# Patient Record
Sex: Male | Born: 2000 | Race: White | Hispanic: No | Marital: Single | Smoking: Never smoker
Health system: Southern US, Community
[De-identification: ages and names within clinical notes are randomized; demographics above are authoritative.]

---

## 2019-11-14 ENCOUNTER — Other Ambulatory Visit: Payer: Self-pay | Admitting: Gastroenterology

## 2019-11-14 DIAGNOSIS — K219 Gastro-esophageal reflux disease without esophagitis: Secondary | ICD-10-CM

## 2019-11-22 ENCOUNTER — Ambulatory Visit
Admission: RE | Admit: 2019-11-22 | Discharge: 2019-11-22 | Disposition: A | Discharge status: Home / Self Care | Payer: Self-pay | Source: Ambulatory Visit | Attending: Gastroenterology | Admitting: Gastroenterology

## 2019-11-22 DIAGNOSIS — K219 Gastro-esophageal reflux disease without esophagitis: Secondary | ICD-10-CM

## 2019-12-06 ENCOUNTER — Other Ambulatory Visit: Payer: Self-pay | Admitting: Gastroenterology

## 2019-12-06 DIAGNOSIS — R1011 Right upper quadrant pain: Secondary | ICD-10-CM

## 2019-12-06 DIAGNOSIS — R748 Abnormal levels of other serum enzymes: Secondary | ICD-10-CM

## 2019-12-15 ENCOUNTER — Ambulatory Visit
Admission: RE | Admit: 2019-12-15 | Discharge: 2019-12-15 | Disposition: A | Discharge status: Home / Self Care | Payer: BC Managed Care – PPO | Source: Ambulatory Visit | Attending: Gastroenterology | Admitting: Gastroenterology

## 2019-12-15 DIAGNOSIS — R1011 Right upper quadrant pain: Secondary | ICD-10-CM

## 2019-12-15 DIAGNOSIS — R748 Abnormal levels of other serum enzymes: Secondary | ICD-10-CM

## 2020-01-17 ENCOUNTER — Other Ambulatory Visit: Payer: Self-pay | Admitting: Gastroenterology

## 2020-01-17 DIAGNOSIS — R748 Abnormal levels of other serum enzymes: Secondary | ICD-10-CM

## 2020-01-24 ENCOUNTER — Ambulatory Visit
Admission: RE | Admit: 2020-01-24 | Discharge: 2020-01-24 | Disposition: A | Discharge status: Home / Self Care | Payer: BC Managed Care – PPO | Source: Ambulatory Visit | Attending: Gastroenterology | Admitting: Gastroenterology

## 2020-01-24 DIAGNOSIS — R748 Abnormal levels of other serum enzymes: Secondary | ICD-10-CM

## 2020-04-01 NOTE — Progress Notes (Signed)
Cardiology Office Note:    Date:  04/02/2020   ID:  Thomas Fletcher, DOB [birth year], MRN [id number]  PCP:  Donnie Coffin, MD  Cardiologist:  No primary care provider on file.  Electrophysiologist:  None   Referring MD: Christa See, FNP   Chief Complaint  Patient presents with  . Chest Pain    History of Present Illness:    Thomas Fletcher is a 19 y.o. male with a hx of NAFLD, BPD, GAD for who is referred by Christa See, FNP for evaluation of chest pain, dyspnea, heart murmur.  Reports chest pain started last summer.  Has been occurring about 1-2 times per month.  Describes as left-sided dull aching pain, can last for several days.  Typically will wake up with it in and will persist for several days.  Reports that he runs on the treadmill every other day for 3.5 miles.  Denies any chest pain with this.  Reports chest pain improves with exertion.  Does report that he has shortness of breath.  Particularly notices when he walks upstairs.  Recent labs showed LDL 112 on 3/31.  No smoking history.  No history of heart disease in his immediate family.    No past medical history on file.    Current Medications: Current Meds  Medication Sig  . Acetylcysteine (NAC PO) Take by mouth.  . lactobacillus acidophilus (BACID) TABS tablet Take 1 tablet by mouth daily.  . Multiple Vitamin (MULTIVITAMIN) tablet Take 1 tablet by mouth daily.  . Omega-3 1000 MG CAPS Take 1 tablet by mouth daily.     Allergies:   Patient has no allergy information on record.   Social History   Socioeconomic History  . Marital status: Single    Spouse name: Not on file  . Number of children: Not on file  . Years of education: Not on file  . Highest education level: Not on file  Occupational History  . Not on file  Tobacco Use  . Smoking status: Never Smoker  . Smokeless tobacco: Never Used  Substance and Sexual Activity  . Alcohol use: Not on file  . Drug use: Not on file  . Sexual activity: Not on file    Other Topics Concern  . Not on file  Social History Narrative  . Not on file   Social Determinants of Health   Financial Resource Strain:   . Difficulty of Paying Living Expenses:   Food Insecurity:   . Worried About Charity fundraiser in the Last Year:   . Arboriculturist in the Last Year:   Transportation Needs:   . Film/video editor (Medical):   Marland Kitchen Lack of Transportation (Non-Medical):   Physical Activity:   . Days of Exercise per Week:   . Minutes of Exercise per Session:   Stress:   . Feeling of Stress :   Social Connections:   . Frequency of Communication with Friends and Family:   . Frequency of Social Gatherings with Friends and Family:   . Attends Religious Services:   . Active Member of Clubs or Organizations:   . Attends Archivist Meetings:   Marland Kitchen Marital Status:      Family History: No history of heart disease in his immediate family  ROS:   Please see the history of present illness.     All other systems reviewed and are negative.  EKGs/Labs/Other Studies Reviewed:    The following studies were reviewed today:   EKG:  EKG is  ordered today.  The ekg ordered today demonstrates normal sinus rhythm, rate 61, no ST/T abnormalities  Recent Labs: No results found for requested labs within last 8760 hours.  Recent Lipid Panel No results found for: CHOL, TRIG, HDL, CHOLHDL, VLDL, LDLCALC, LDLDIRECT  Physical Exam:    VS:  BP 130/76   Pulse 61   Ht 5\' 9"  (1.753 m)   Wt 174 lb 6.4 oz (79.1 kg)   BMI 25.75 kg/m     Wt Readings from Last 3 Encounters:  04/02/20 174 lb 6.4 oz (79.1 kg)     GEN:  Well nourished, well developed in no acute distress HEENT: Normal NECK: No JVD LYMPHATICS: No lymphadenopathy CARDIAC: RRR, 1/6 systolic heart murmur RESPIRATORY:  Clear to auscultation without rales, wheezing or rhonchi  ABDOMEN: Soft, non-tender, non-distended MUSCULOSKELETAL:  No edema; No deformity  SKIN: Warm and dry NEUROLOGIC:  Alert  and oriented x 3 PSYCHIATRIC:  Normal affect   ASSESSMENT:    1. Cardiac murmur   2. Chest pain of uncertain etiology   3. Hyperlipidemia, unspecified hyperlipidemia type    PLAN:     Chest pain: Noncardiac in description, improves with exertion.  Given age and lack of risk factors, no further cardiac work-up recommended  Heart murmur: Suspect benign flow murmur, will check TTE to rule out any structural heart disease.  Hyperlipidemia: LDL 112, reports previously was in 130s but has improved with diet/exercise.  Given age and lack of risk factors, no indication for statin at this time.  RTC as needed   Medication Adjustments/Labs and Tests Ordered: Current medicines are reviewed at length with the patient today.  Concerns regarding medicines are outlined above.  Orders Placed This Encounter  Procedures  . EKG 12-Lead  . ECHOCARDIOGRAM COMPLETE   No orders of the defined types were placed in this encounter.   Patient Instructions  Medication Instructions:  Your physician recommends that you continue on your current medications as directed. Please refer to the Current Medication list given to you today.  Testing/Procedures: Your physician has requested that you have an echocardiogram. Echocardiography is a painless test that uses sound waves to create images of your heart. It provides your doctor with information about the size and shape of your heart and how well your heart's chambers and valves are working. This procedure takes approximately one hour. There are no restrictions for this procedure. This will be done at our [facility] location:  [facility] [address]   Follow-Up: At [facility], you and your health needs are our priority.  As part of our continuing mission to provide you with exceptional heart care, we have created designated Provider Care Teams.  These Care Teams include your primary Cardiologist (physician) and Advanced Practice Providers (APPs  -  Physician Assistants and Nurse Practitioners) who all work together to provide you with the care you need, when you need it.  We recommend signing up for the patient portal called "MyChart".  Sign up information is provided on this After Visit Summary.  MyChart is used to connect with patients for Virtual Visits (Telemedicine).  Patients are able to view lab/test results, encounter notes, upcoming appointments, etc.  Non-urgent messages can be sent to your provider as well.   To learn more about what you can do with MyChart, go to [employer].    Your next appointment:    AS NEEDED with Dr. [email or web address]       Signed, Bjorn Pippin  Bjorn Pippin, MD  04/02/2020 6:42 PM    [facility]

## 2020-04-02 ENCOUNTER — Encounter: Payer: Self-pay | Admitting: Cardiology

## 2020-04-02 ENCOUNTER — Ambulatory Visit: Payer: BC Managed Care – PPO | Admitting: Cardiology

## 2020-04-02 ENCOUNTER — Other Ambulatory Visit: Payer: Self-pay

## 2020-04-02 VITALS — BP 130/76 | HR 61 | Ht 69.0 in | Wt 174.4 lb

## 2020-04-02 DIAGNOSIS — R079 Chest pain, unspecified: Secondary | ICD-10-CM

## 2020-04-02 DIAGNOSIS — E785 Hyperlipidemia, unspecified: Secondary | ICD-10-CM

## 2020-04-02 DIAGNOSIS — R011 Cardiac murmur, unspecified: Secondary | ICD-10-CM | POA: Diagnosis not present

## 2020-04-02 NOTE — Patient Instructions (Signed)
Medication Instructions:  Your physician recommends that you continue on your current medications as directed. Please refer to the Current Medication list given to you today.  Testing/Procedures: Your physician has requested that you have an echocardiogram. Echocardiography is a painless test that uses sound waves to create images of your heart. It provides your doctor with information about the size and shape of your heart and how well your heart's chambers and valves are working. This procedure takes approximately one hour. There are no restrictions for this procedure.  This will be done at our [address] location:  [address]  Follow-Up: At [facility], you and your health needs are our priority.  As part of our continuing mission to provide you with exceptional heart care, we have created designated Provider Care Teams.  These Care Teams include your primary Cardiologist (physician) and Advanced Practice Providers (APPs -  Physician Assistants and Nurse Practitioners) who all work together to provide you with the care you need, when you need it.  We recommend signing up for the patient portal called "MyChart".  Sign up information is provided on this After Visit Summary.  MyChart is used to connect with patients for Virtual Visits (Telemedicine).  Patients are able to view lab/test results, encounter notes, upcoming appointments, etc.  Non-urgent messages can be sent to your provider as well.   To learn more about what you can do with MyChart, go to [email or web address].    Your next appointment:   AS NEEDED with Dr. Schumann 

## 2020-04-23 ENCOUNTER — Other Ambulatory Visit: Payer: Self-pay

## 2020-04-23 ENCOUNTER — Ambulatory Visit (HOSPITAL_COMMUNITY): Payer: BC Managed Care – PPO | Attending: Cardiology

## 2020-04-23 DIAGNOSIS — R011 Cardiac murmur, unspecified: Secondary | ICD-10-CM | POA: Diagnosis present

## 2020-04-26 ENCOUNTER — Telehealth: Payer: Self-pay | Admitting: Cardiology

## 2020-04-26 NOTE — Telephone Encounter (Signed)
Pt given his echo results and he asked to let Dr. Bjorn Pippin know that he is still having some chest pressure on and off and was asking if there is any other test he can have done to be sure it is not something serious.   I advised him that I will forward to Dr. Bjorn Pippin for review.

## 2020-04-26 NOTE — Telephone Encounter (Signed)
New Message  Pt called and wanted to get echo results. Told pt that the nurse sent the results to his mychart, but still wants someone to explain it better to him.  Please call to explain.

## 2020-04-27 NOTE — Telephone Encounter (Signed)
Can we schedule appointment to go over?

## 2020-04-27 NOTE — Telephone Encounter (Signed)
Spoke to patient, schedule OV 5/27 at 2:20 pm

## 2020-05-03 ENCOUNTER — Ambulatory Visit (INDEPENDENT_AMBULATORY_CARE_PROVIDER_SITE_OTHER): Payer: BC Managed Care – PPO | Admitting: Cardiology

## 2020-05-03 ENCOUNTER — Other Ambulatory Visit: Payer: Self-pay

## 2020-05-03 VITALS — BP 112/64 | HR 58 | Ht 69.0 in | Wt 175.4 lb

## 2020-05-03 DIAGNOSIS — R011 Cardiac murmur, unspecified: Secondary | ICD-10-CM

## 2020-05-03 DIAGNOSIS — R079 Chest pain, unspecified: Secondary | ICD-10-CM | POA: Diagnosis not present

## 2020-05-03 DIAGNOSIS — E785 Hyperlipidemia, unspecified: Secondary | ICD-10-CM

## 2020-05-03 NOTE — Patient Instructions (Signed)
Medication Instructions:  Your physician recommends that you continue on your current medications as directed. Please refer to the Current Medication list given to you today.  Follow-Up: At [facility], you and your health needs are our priority.  As part of our continuing mission to provide you with exceptional heart care, we have created designated Provider Care Teams.  These Care Teams include your primary Cardiologist (physician) and Advanced Practice Providers (APPs -  Physician Assistants and Nurse Practitioners) who all work together to provide you with the care you need, when you need it.  We recommend signing up for the patient portal called "MyChart".  Sign up information is provided on this After Visit Summary.  MyChart is used to connect with patients for Virtual Visits (Telemedicine).  Patients are able to view lab/test results, encounter notes, upcoming appointments, etc.  Non-urgent messages can be sent to your provider as well.   To learn more about what you can do with MyChart, go to [email or web address].    Your next appointment:   AS NEEDED with Dr. Schumann 

## 2020-05-03 NOTE — Progress Notes (Addendum)
Cardiology Office Note:    Date:  05/04/2020   ID:  Thomas Fletcher, DOB 2001-07-28, MRN [id number]  PCP:  Terald Sleeper, MD  Cardiologist:  No primary care provider on file.  Electrophysiologist:  None   Referring MD: Terald Sleeper, MD   No chief complaint on file.   History of Present Illness:    Thomas Fletcher is a 19 y.o. male with a hx of NAFLD, BPD, GAD who presents for follow-up.  He was referred by Ayesha Rumpf, FNP for evaluation of chest pain, dyspnea, heart murmur, initially seen on 04/02/2020.  Reports chest pain started last summer.  Has been occurring about 1-2 times per month.  Describes as left-sided dull aching pain, can last for several days.  Typically will wake up with it in and will persist for several days.  Reports that he runs on the treadmill every other day for 3.5 miles.  Denies any chest pain with this.  Reports chest pain improves with exertion.  Does report that he has shortness of breath.  Particularly notices when he walks upstairs.  Recent labs showed LDL 112 on 3/31.  No smoking history.  No history of heart disease in his immediate family.  TTE on 04/23/2020 showed normal biventricular function, no significant valvular disease.  Since last clinic visit, he reports that he continues to have chest pain.  Described as far left-sided dull aching pain.  Does not note any relationship with exertion.  He has been walking during the elliptical, denies any exertional symptoms.   No past medical history on file.    Current Medications: No outpatient medications have been marked as taking for the 05/03/20 encounter (Office Visit) with Little Ishikawa, MD.     Allergies:   Other   Social History   Socioeconomic History  . Marital status: Single    Spouse name: Not on file  . Number of children: Not on file  . Years of education: Not on file  . Highest education level: Not on file  Occupational History  . Not on file  Tobacco Use  . Smoking  status: Never Smoker  . Smokeless tobacco: Never Used  Substance and Sexual Activity  . Alcohol use: Not on file  . Drug use: Not on file  . Sexual activity: Not on file  Other Topics Concern  . Not on file  Social History Narrative  . Not on file   Social Determinants of Health   Financial Resource Strain:   . Difficulty of Paying Living Expenses:   Food Insecurity:   . Worried About Programme researcher, broadcasting/film/video in the Last Year:   . Barista in the Last Year:   Transportation Needs:   . Freight forwarder (Medical):   Marland Kitchen Lack of Transportation (Non-Medical):   Physical Activity:   . Days of Exercise per Week:   . Minutes of Exercise per Session:   Stress:   . Feeling of Stress :   Social Connections:   . Frequency of Communication with Friends and Family:   . Frequency of Social Gatherings with Friends and Family:   . Attends Religious Services:   . Active Member of Clubs or Organizations:   . Attends Banker Meetings:   Marland Kitchen Marital Status:      Family History: No history of heart disease in his immediate family  ROS:   Please see the history of present illness.     All other systems reviewed and  are negative.  EKGs/Labs/Other Studies Reviewed:    The following studies were reviewed today:   EKG:  EKG is ordered today.  The ekg ordered 05/04/2020 demonstrates normal sinus rhythm, rate 58, no ST/T abnormalities  Recent Labs: No results found for requested labs within last 8760 hours.  Recent Lipid Panel No results found for: CHOL, TRIG, HDL, CHOLHDL, VLDL, LDLCALC, LDLDIRECT  Physical Exam:    VS:  BP 112/64   Pulse (!) 58   Ht 5\' 9"  (1.753 m)   Wt 175 lb 6.4 oz (79.6 kg)   SpO2 98%   BMI 25.90 kg/m     Wt Readings from Last 3 Encounters:  05/03/20 175 lb 6.4 oz (79.6 kg)  04/02/20 174 lb 6.4 oz (79.1 kg)     GEN:  Well nourished, well developed in no acute distress HEENT: Normal NECK: No JVD LYMPHATICS: No lymphadenopathy CARDIAC:  RRR, 1/6 systolic heart murmur RESPIRATORY:  Clear to auscultation without rales, wheezing or rhonchi  ABDOMEN: Soft, non-tender, non-distended MUSCULOSKELETAL:  No edema; No deformity  SKIN: Warm and dry NEUROLOGIC:  Alert and oriented x 3 PSYCHIATRIC:  Normal affect   ASSESSMENT:    1. Chest pain of uncertain etiology   2. Cardiac murmur   3. Hyperlipidemia, unspecified hyperlipidemia type    PLAN:     Chest pain: Noncardiac in description, improves with exertion.  Given age and lack of risk factors, no further cardiac work-up recommended  Heart murmur: Suspect benign flow murmur, TTE shows no evidence of structural heart disease  Hyperlipidemia: LDL 112, reports previously was in 130s but has improved with diet/exercise.  Given age and lack of risk factors, no indication for statin at this time.  RTC as needed   Medication Adjustments/Labs and Tests Ordered: Current medicines are reviewed at length with the patient today.  Concerns regarding medicines are outlined above.  Orders Placed This Encounter  Procedures  . EKG 12-Lead   No orders of the defined types were placed in this encounter.   Patient Instructions  Medication Instructions:  Your physician recommends that you continue on your current medications as directed. Please refer to the Current Medication list given to you today.  Follow-Up: At [facility], you and your health needs are our priority.  As part of our continuing mission to provide you with exceptional heart care, we have created designated Provider Care Teams.  These Care Teams include your primary Cardiologist (physician) and Advanced Practice Providers (APPs -  Physician Assistants and Nurse Practitioners) who all work together to provide you with the care you need, when you need it.  We recommend signing up for the patient portal called "MyChart".  Sign up information is provided on this After Visit Summary.  MyChart is used to connect with  patients for Virtual Visits (Telemedicine).  Patients are able to view lab/test results, encounter notes, upcoming appointments, etc.  Non-urgent messages can be sent to your provider as well.   To learn more about what you can do with MyChart, go to [email or web address].    Your next appointment:    AS NEEDED with Dr. Gardiner Rhyme      Signed, Donato Heinz, MD  05/04/2020 12:16 AM    [place]

## 2020-08-31 ENCOUNTER — Other Ambulatory Visit: Payer: Self-pay | Admitting: Nurse Practitioner

## 2020-09-03 ENCOUNTER — Other Ambulatory Visit: Payer: Self-pay | Admitting: Nurse Practitioner

## 2020-09-03 DIAGNOSIS — R932 Abnormal findings on diagnostic imaging of liver and biliary tract: Secondary | ICD-10-CM

## 2020-09-03 DIAGNOSIS — R748 Abnormal levels of other serum enzymes: Secondary | ICD-10-CM

## 2020-09-13 ENCOUNTER — Ambulatory Visit
Admission: RE | Admit: 2020-09-13 | Discharge: 2020-09-13 | Disposition: A | Discharge status: Home / Self Care | Payer: BC Managed Care – PPO | Source: Ambulatory Visit | Attending: Nurse Practitioner | Admitting: Nurse Practitioner

## 2020-09-13 DIAGNOSIS — R748 Abnormal levels of other serum enzymes: Secondary | ICD-10-CM

## 2020-09-13 DIAGNOSIS — R932 Abnormal findings on diagnostic imaging of liver and biliary tract: Secondary | ICD-10-CM

## 2020-10-24 ENCOUNTER — Other Ambulatory Visit: Payer: Self-pay | Admitting: Nurse Practitioner

## 2020-10-24 ENCOUNTER — Other Ambulatory Visit (HOSPITAL_COMMUNITY): Payer: Self-pay | Admitting: Nurse Practitioner

## 2020-10-24 DIAGNOSIS — R1011 Right upper quadrant pain: Secondary | ICD-10-CM

## 2020-10-24 DIAGNOSIS — R748 Abnormal levels of other serum enzymes: Secondary | ICD-10-CM

## 2020-11-13 ENCOUNTER — Telehealth (INDEPENDENT_AMBULATORY_CARE_PROVIDER_SITE_OTHER): Payer: BC Managed Care – PPO | Admitting: Internal Medicine

## 2020-11-13 ENCOUNTER — Encounter: Payer: Self-pay | Admitting: Internal Medicine

## 2020-11-13 VITALS — BP 134/86 | Wt 160.0 lb

## 2020-11-13 DIAGNOSIS — E291 Testicular hypofunction: Secondary | ICD-10-CM | POA: Diagnosis not present

## 2020-11-13 DIAGNOSIS — R079 Chest pain, unspecified: Secondary | ICD-10-CM

## 2020-11-13 DIAGNOSIS — E785 Hyperlipidemia, unspecified: Secondary | ICD-10-CM | POA: Diagnosis not present

## 2020-11-13 NOTE — Patient Instructions (Signed)
Medication Instructions:  NO CHANGE  *If you need a refill on your cardiac medications before your next appointment, please call your pharmacy*   Follow-Up: At [facility], you and your health needs are our priority.  As part of our continuing mission to provide you with exceptional heart care, we have created designated Provider Care Teams.  These Care Teams include your primary Cardiologist (physician) and Advanced Practice Providers (APPs -  Physician Assistants and Nurse Practitioners) who all work together to provide you with the care you need, when you need it.  We recommend signing up for the patient portal called "MyChart".  Sign up information is provided on this After Visit Summary.  MyChart is used to connect with patients for Virtual Visits (Telemedicine).  Patients are able to view lab/test results, encounter notes, upcoming appointments, etc.  Non-urgent messages can be sent to your provider as well.   To learn more about what you can do with MyChart, go to [email or web address].    Your next appointment:   AS NEEDED with Dr. Rennis Golden for lipid management

## 2020-11-13 NOTE — Progress Notes (Signed)
Virtual Visit via Video Note   This visit type was conducted due to national recommendations for restrictions regarding the COVID-19 Pandemic (e.g. social distancing) in an effort to limit this patient's exposure and mitigate transmission in our community.  Due to his co-morbid illnesses, this patient is at least at moderate risk for complications without adequate follow up.  This format is felt to be most appropriate for this patient at this time.  All issues noted in this document were discussed and addressed.  A limited physical exam was performed with this format.  Please refer to the patient's chart for his consent to telehealth for [facility].      Date:  11/13/2020   ID:  Thomas Fletcher, DOB 02/01/2001, MRN [id number] The patient was identified using 2 identifiers.  Evaluation Performed:  New Patient Evaluation  Patient Location:  [address] [place] [address]  Provider location:   [address] [place], [place] [address]  PCP:  Dois Davenport, MD  Cardiologist:  No primary care provider on file. Electrophysiologist:  None   Chief Complaint:  Dyslipidemia  History of Present Illness:    Thomas Fletcher is a 19 y.o. male who presents via audio/video conferencing for a telehealth visit today.  This is a 19 year old male kindly referred by Dr. Talmage Nap for evaluation and management of dyslipidemia.  Mr. Paulita Fujita had been seen previously by my partner Dr. Bjorn Pippin for chest pain which was felt to be atypical.  No further testing or work-up was recommended.  Mr. Paulita Fujita is very physically active, exercising about 5 times a week and eats a vegan diet.  He referred to Dr. Talmage Nap for dyslipidemia and was noted to have low testosterone and abnormal lipids.  Prior to seeing me though he had been referred to endocrinology at [facility] and saw Dr. Corwin Levins on November 2.  At the time he was noted to have a testosterone level of 360 which is certainly lower for his  age and concerns him although he is asymptomatic with and is able to exercise and has normal erections.  With regards to his lipids, he had a lipid NMR performed due to the fact that he was thought to have some abnormal liver architecture concerning for possible fatty liver disease.  I did review these values which were posted in Dr. Michaelle Copas consult note indicating a low LP(a) of less than 10, LP PLA-2 activity of 98, a high-sensitivity CRP of 0.8, a low APO B number of 93, and elevated LDL particle number of 1501 with a small LDL-PF 411, LDL-C of 104, triglycerides 144, HDL low at 37 and total cholesterol 165.  Based on these numbers he had not recommended any therapy.  He felt that his target LDL-P was less than 1300.  I would agree with that however I do feel that his LDL-P particle numbers are in excess of his APO B numbers which is interesting.  He is noted to have some fat in the liver suggesting that perhaps he is not producing mature LDL particles.  Typically every LDL particle contains in ApoB lipoprotein and based on his LDL P numbers his APO B would be expected to be higher.  This discordance is oftentimes due to insulin resistance however it does not appear to be the case for this gentleman.  He is not yet had a clear diagnosis from the hepatologist as to his fatty liver findings.  The patient does not have symptoms concerning for COVID-19  infection (fever, chills, cough, or new SHORTNESS OF BREATH).    Prior CV studies:   The following studies were reviewed today:  Chart reviewed  PMHx:  History reviewed. No pertinent past medical history.  History reviewed. No pertinent surgical history.  FAMHx:  History reviewed. No pertinent family history.  SOCHx:   reports that he has never smoked. He has never used smokeless tobacco. No history on file for alcohol use and drug use.  ALLERGIES:  Allergies  Allergen Reactions  . Other Other (See Comments)    MEDS:  No outpatient  medications have been marked as taking for the 11/13/20 encounter (Video Visit) with Chrystie Nose, MD.     ROS: Pertinent items noted in HPI and remainder of comprehensive ROS otherwise negative.  Labs/Other Tests and Data Reviewed:    Recent Labs: No results found for requested labs within last 8760 hours.   Recent Lipid Panel No results found for: CHOL, TRIG, HDL, CHOLHDL, LDLCALC, LDLDIRECT  Wt Readings from Last 3 Encounters:  11/13/20 160 lb (72.6 kg)  05/03/20 175 lb 6.4 oz (79.6 kg)  04/02/20 174 lb 6.4 oz (79.1 kg)     Exam:    Vital Signs:  BP 134/86   Wt 160 lb (72.6 kg)   BMI 23.63 kg/m    General appearance: alert and no distress Lungs: No visual respiratory difficulty Abdomen: Thin Extremities: extremities normal, atraumatic, no cyanosis or edema Neurologic: Grossly normal Psych: Pleasant  ASSESSMENT & PLAN:    1. Mixed dyslipidemia with elevated LDL-P 2. Mildly decreased testosterone 3. Atypical chest pain  Mr. Wooton has a mixed dyslipidemia with primarily elevated LDL-P but not significantly elevated not consistent with a familial hyperlipidemia.  The rest of his LDL particle testing was fairly unremarkable with a lower than expected APO B level, a negative LP(a), unremarkable LP-PLA 2 and nonspecific hs-CRP of 0.8.  It is possible that he is overproducing LDL-P in the liver leading to increased hepatic concentrations which might explain the divergence of his LDL-P and APO B levels.  Its not clear whether statin therapy would improve this or possibly worsen his liver enzymes.  It did seem that he was unusually anxious about these findings and I tried to reassure him that there is no significant early onset heart disease in his family or warning signs and his lipid profile concerning for significant genetic dyslipidemia or the need for additional testing.  Thanks for consulting me for second opinion.  Follow-up as needed.  COVID-19 Education: The signs  and symptoms of COVID-19 were discussed with the patient and how to seek care for testing (follow up with PCP or arrange E-visit).  The importance of social distancing was discussed today.  Patient Risk:   After full review of this patients clinical status, I feel that they are at least moderate risk at this time.  Time:   Today, I have spent 25 minutes with the patient with telehealth technology discussing dyslipidemia.     Medication Adjustments/Labs and Tests Ordered: Current medicines are reviewed at length with the patient today.  Concerns regarding medicines are outlined above.   Tests Ordered: No orders of the defined types were placed in this encounter.   Medication Changes: No orders of the defined types were placed in this encounter.   Disposition:  prn  Chrystie Nose, MD, Milagros Loll  [facility]  Medical Director of the Advanced Lipid Disorders &  Cardiovascular Risk Reduction Clinic Diplomate of  the [employer] of Clinical Lipidology Attending Cardiologist  Direct Dial: [phone]  Fax: [phone]  Website:  [email or web address]  Chrystie Nose, MD  11/13/2020 9:44 AM

## 2020-11-16 ENCOUNTER — Other Ambulatory Visit: Payer: Self-pay

## 2020-11-16 ENCOUNTER — Encounter (HOSPITAL_COMMUNITY)
Admission: RE | Admit: 2020-11-16 | Discharge: 2020-11-16 | Disposition: A | Discharge status: Home / Self Care | Payer: BC Managed Care – PPO | Source: Ambulatory Visit | Attending: Nurse Practitioner | Admitting: Nurse Practitioner

## 2020-11-16 DIAGNOSIS — R1011 Right upper quadrant pain: Secondary | ICD-10-CM | POA: Diagnosis present

## 2020-11-16 DIAGNOSIS — R748 Abnormal levels of other serum enzymes: Secondary | ICD-10-CM | POA: Insufficient documentation

## 2020-11-16 MED ORDER — TECHNETIUM TC 99M MEBROFENIN IV KIT
5.4000 | PACK | Freq: Once | INTRAVENOUS | Status: AC | PRN
  Administered 2020-11-16: 5.4 via INTRAVENOUS

## 2020-12-20 ENCOUNTER — Telehealth (INDEPENDENT_AMBULATORY_CARE_PROVIDER_SITE_OTHER): Payer: BC Managed Care – PPO | Admitting: Cardiology

## 2020-12-20 ENCOUNTER — Encounter: Payer: Self-pay | Admitting: *Deleted

## 2020-12-20 ENCOUNTER — Encounter: Payer: Self-pay | Admitting: Cardiology

## 2020-12-20 ENCOUNTER — Telehealth: Payer: Self-pay | Admitting: Cardiology

## 2020-12-20 VITALS — Ht 69.0 in | Wt 157.0 lb

## 2020-12-20 DIAGNOSIS — E785 Hyperlipidemia, unspecified: Secondary | ICD-10-CM | POA: Diagnosis not present

## 2020-12-20 DIAGNOSIS — R079 Chest pain, unspecified: Secondary | ICD-10-CM | POA: Diagnosis not present

## 2020-12-20 NOTE — Telephone Encounter (Signed)
Spoke with patient regarding appointemnt for COVID prescreening scheduled 12/25/20 at 2:15 pm at [address]---ETT scheduled 1./20/22 at 3:30 pm at Dr. Campbell Lerner office and 3 month follow up scheduled 04/11/21 at 1:20 pm.  Patient voiced his understanding.

## 2020-12-20 NOTE — Patient Instructions (Signed)
Medication Instructions:  Your physician recommends that you continue on your current medications as directed. Please refer to the Current Medication list given to you today.  Testing/Procedures: Your physician has requested that you have an exercise tolerance test. For further information please visit [email or web address] Please also follow instruction sheet, as given.  -this test requires a covid screening 3 days prior, we will schedule this for you.   Follow-Up: At [facility], you and your health needs are our priority.  As part of our continuing mission to provide you with exceptional heart care, we have created designated Provider Care Teams.  These Care Teams include your primary Cardiologist (physician) and Advanced Practice Providers (APPs -  Physician Assistants and Nurse Practitioners) who all work together to provide you with the care you need, when you need it.  We recommend signing up for the patient portal called "MyChart".  Sign up information is provided on this After Visit Summary.  MyChart is used to connect with patients for Virtual Visits (Telemedicine).  Patients are able to view lab/test results, encounter notes, upcoming appointments, etc.  Non-urgent messages can be sent to your provider as well.   To learn more about what you can do with MyChart, go to [email or web address].    Your next appointment:   3 month(s)  The format for your next appointment:   In Person  Provider:   Epifanio Lesches, MD

## 2020-12-20 NOTE — Progress Notes (Signed)
Virtual Visit via Telephone Note   This visit type was conducted due to national recommendations for restrictions regarding the COVID-19 Pandemic (e.g. social distancing) in an effort to limit this patient's exposure and mitigate transmission in our community.  Due to his co-morbid illnesses, this patient is at least at moderate risk for complications without adequate follow up.  This format is felt to be most appropriate for this patient at this time.  The patient did not have access to video technology/had technical difficulties with video requiring transitioning to audio format only (telephone).  All issues noted in this document were discussed and addressed.  No physical exam could be performed with this format.  Please refer to the patient's chart for his  consent to telehealth for [facility].   Date:  12/21/2020   ID:  Thomas Fletcher, DOB [birth year], MRN [id number]  Patient Location: Home Provider Location: Home Office  PCP:  Mattie Marlin, DO  Cardiologist:  No primary care provider on file.  Electrophysiologist:  None   Evaluation Performed:  Follow-Up Visit  Chief Complaint:  Chest pain  History of Present Illness:    Thomas Fletcher is a 20 y.o. male Cwith a hx of NAFLD, BPD, GAD who presents for follow-up.  He was referred by Ayesha Rumpf, FNP for evaluation of chest pain, dyspnea, heart murmur, initially seen on 04/02/2020.  Reports chest pain started last summer.  Has been occurring about 1-2 times per month.  Describes as left-sided dull aching pain, can last for several days.  Typically will wake up with it in and will persist for several days.  Reports that he runs on the treadmill every other day for 3.5 miles.  Denies any chest pain with this.  Reports chest pain improves with exertion.  Does report that he has shortness of breath.  Particularly notices when he walks upstairs.  Recent labs showed LDL 112 on 3/31.  No smoking history.  No history of heart disease in his  immediate family.  TTE on 04/23/2020 showed normal biventricular function, no significant valvular disease.  Since last clinic visit, he reports he is continuing to have chest pain.  Episodes can last anywhere from 2 to 20 minutes.  Has mostly occurred at rest but has recently noted some chest pain when walking up stairs.  Also has been having dyspnea on exertion.   No past medical history on file. No past surgical history on file.   Current Meds  Medication Sig  . Desvenlafaxine Succinate ER 25 MG TB24 Take 1 tablet by mouth daily.     Allergies:   Other   Social History   Tobacco Use  . Smoking status: Never Smoker  . Smokeless tobacco: Never Used     Family Hx: The patient's family history is not on file.  ROS:   Please see the history of present illness.     All other systems reviewed and are negative.   Prior CV studies:   The following studies were reviewed today:   Labs/Other Tests and Data Reviewed:    EKG:  No ECG reviewed.  Recent Labs: No results found for requested labs within last 8760 hours.   Recent Lipid Panel No results found for: CHOL, TRIG, HDL, CHOLHDL, LDLCALC, LDLDIRECT  Wt Readings from Last 3 Encounters:  12/20/20 157 lb (71.2 kg)  11/13/20 160 lb (72.6 kg)  05/03/20 175 lb 6.4 oz (79.6 kg)     Objective:    Vital Signs:  Ht 5'  9" (1.753 m)   Wt 157 lb (71.2 kg)   BMI 23.18 kg/m    VITAL SIGNS:  reviewed GEN:  no acute distress, able to converse comfortable  ASSESSMENT & PLAN:    Chest pain: Suspect noncardiac chest pain given age and lack of risk factors, but now reporting exertional chest pain.  Will evaluate further with ETT   Heart murmur: Suspect benign flow murmur, TTE shows no evidence of structural heart disease  Hyperlipidemia: LDL 112, reports previously was in 130s but has improved with diet/exercise.  Given age and lack of risk factors, no indication for statin at this time.  He has been evaluated by Dr. Rennis Golden in  lipid clinic, did not recommend any medication at this time  RTC in 3 months  Shared Decision Making/Informed Consent The risks [chest pain, shortness of breath, cardiac arrhythmias, dizziness, blood pressure fluctuations, myocardial infarction, stroke/transient ischemic attack, and life-threatening complications (estimated to be 1 in 10,000)], benefits (risk stratification, diagnosing coronary artery disease, treatment guidance) and alternatives of an exercise tolerance test were discussed in detail with Mr. Demaria and he agrees to proceed.      Time:   Today, I have spent 9 minutes with the patient with telehealth technology discussing the above problems.     Medication Adjustments/Labs and Tests Ordered: Current medicines are reviewed at length with the patient today.  Concerns regarding medicines are outlined above.   Tests Ordered: Orders Placed This Encounter  Procedures  . EXERCISE TOLERANCE TEST (ETT)    Medication Changes: No orders of the defined types were placed in this encounter.   Follow Up:  In Person in 3 month(s)  Signed, Little Ishikawa, MD  12/21/2020 12:35 AM    [facility]

## 2020-12-25 ENCOUNTER — Other Ambulatory Visit (HOSPITAL_COMMUNITY)
Admission: RE | Admit: 2020-12-25 | Discharge: 2020-12-25 | Disposition: A | Discharge status: Home / Self Care | Payer: BC Managed Care – PPO | Source: Ambulatory Visit | Attending: Cardiology | Admitting: Cardiology

## 2020-12-25 DIAGNOSIS — Z01812 Encounter for preprocedural laboratory examination: Secondary | ICD-10-CM | POA: Diagnosis not present

## 2020-12-25 DIAGNOSIS — Z20822 Contact with and (suspected) exposure to covid-19: Secondary | ICD-10-CM | POA: Insufficient documentation

## 2020-12-26 LAB — SARS CORONAVIRUS 2 (TAT 6-24 HRS): SARS Coronavirus 2: NEGATIVE

## 2020-12-27 ENCOUNTER — Other Ambulatory Visit: Payer: Self-pay

## 2020-12-27 ENCOUNTER — Ambulatory Visit (HOSPITAL_COMMUNITY)
Admission: RE | Admit: 2020-12-27 | Discharge: 2020-12-27 | Disposition: A | Discharge status: Home / Self Care | Payer: BC Managed Care – PPO | Source: Ambulatory Visit | Attending: Cardiology | Admitting: Cardiology

## 2020-12-27 ENCOUNTER — Telehealth (HOSPITAL_COMMUNITY): Payer: Self-pay | Admitting: *Deleted

## 2020-12-27 DIAGNOSIS — R079 Chest pain, unspecified: Secondary | ICD-10-CM | POA: Diagnosis not present

## 2020-12-27 LAB — EXERCISE TOLERANCE TEST
Estimated workload: 17.5 METS
Exercise duration (min): 15 min
Exercise duration (sec): 15 s
MPHR: 199 {beats}/min
Peak HR: 173 {beats}/min
Percent HR: 86 %
Rest HR: 51 {beats}/min

## 2020-12-27 NOTE — Telephone Encounter (Signed)
Close encounter 

## 2021-01-17 ENCOUNTER — Other Ambulatory Visit: Payer: Self-pay | Admitting: Nurse Practitioner

## 2021-02-28 ENCOUNTER — Ambulatory Visit (INDEPENDENT_AMBULATORY_CARE_PROVIDER_SITE_OTHER): Payer: BC Managed Care – PPO | Admitting: Urology

## 2021-02-28 ENCOUNTER — Encounter: Payer: Self-pay | Admitting: Urology

## 2021-02-28 ENCOUNTER — Other Ambulatory Visit: Payer: Self-pay

## 2021-02-28 VITALS — BP 138/76 | HR 97 | Ht 69.0 in | Wt 160.0 lb

## 2021-02-28 DIAGNOSIS — R6882 Decreased libido: Secondary | ICD-10-CM | POA: Diagnosis not present

## 2021-02-28 DIAGNOSIS — F524 Premature ejaculation: Secondary | ICD-10-CM

## 2021-02-28 DIAGNOSIS — N529 Male erectile dysfunction, unspecified: Secondary | ICD-10-CM

## 2021-02-28 MED ORDER — TADALAFIL 5 MG PO TABS: 5.0000 mg | ORAL_TABLET | Freq: Every day | ORAL | 0 refills | Status: DC | PRN

## 2021-02-28 NOTE — Progress Notes (Signed)
02/28/2021 2:43 PM   [name] 10/10/2001 [id number]  Referring provider: Enid Baas, MD [address] [place],  [place] [address]  Chief Complaint  Patient presents with  . Erectile Dysfunction    HPI: Thomas Fletcher is a 20 y.o. male referred for evaluation of erectile dysfunction.   He has 5 problems he desired to discuss today; low testosterone, low libido, erectile dysfunction, premature ejaculation and a whitish area on his glans penis    States his testosterone has been low for his age and complains of low libido, mild decreased energy  Was seen by an endocrinologist in the [facility] network in 2021; he had a normal total testosterone and normal SHBG, free and bioavailable testosterone.  TRT was not recommended  Labs 02/07/2021 remarkable for a total testosterone level of 362 and normal LH/FSH  Recently establish care with Dr. Nemiah Commander and endocrinology referral locally has been placed   Complains of difficulty achieving and maintaining an erection  Notes decreased rigidity of early morning erections  Has taken Cialis with good efficacy though is concerned about taking due to history of elevated LFTs and hepatic steatosis  Interested in further testing to determine the etiology of his ED  No significant organic risk factors    Lifelong history premature ejaculation which has worsened  States initially was unable to maintain erections for a minute prior to ejaculation however recently 10-20 seconds  Has tried condoms and topical anesthetic preparations   Notes a small whitish macular area dorsal glans without symptoms that he wanted to have checked   PMH: History reviewed. No pertinent past medical history.  Surgical History: History reviewed. No pertinent surgical history.  Home Medications:  Allergies as of 02/28/2021      Reactions   Other Other (See Comments)      Medication List       Accurate as of February 28, 2021  2:43 PM. If  you have any questions, ask your nurse or doctor.        Desvenlafaxine Succinate ER 25 MG Tb24 Take 1 tablet by mouth daily.       Allergies:  Allergies  Allergen Reactions  . Other Other (See Comments)    Family History: History reviewed. No pertinent family history.  Social History:  reports that he has never smoked. He has never used smokeless tobacco. No history on file for alcohol use and drug use.   Physical Exam: BP 138/76   Pulse 97   Ht 5\' 9"  (1.753 m)   Wt 160 lb (72.6 kg)   BMI 23.63 kg/m   Constitutional:  Alert, No acute distress. HEENT: [place] AT, moist mucus membranes.  Trachea midline, no masses. Cardiovascular: No clubbing, cyanosis, or edema. Respiratory: Normal respiratory effort, no increased work of breathing. GU:  Skin: No rashes, bruises or suspicious lesions. Neurologic: Grossly intact, no focal deficits, moving all 4 extremities. Psychiatric: Normal mood and affect.   Assessment & Plan:    1.  Low normal testosterone  Available testosterone levels have been normal to low normal and prior free and bioavailable testosterone levels were within the normal range  Would not recommend TRT due to current levels and effect on spermatogenesis as there have been cases where spermatogenesis does not recover after discontinuing TRT  Would not recommend Clomid due to history of liver disease  We discussed ways to boost natural testosterone levels and I recommended he began resistance/weight training  He has been referred to endocrinology specifically for low  testosterone and will defer to the endocrinologist recommendations  2.  Erectile dysfunction  Tadalafil has been effective and he was taking the 5 mg dose which would not be contraindicated without a diagnosis of significant liver disease/liver failure.  Rx was sent to pharmacy  Regarding etiology of his ED etiology the likelihood of arterial insufficiency is very unlikely.  We did discuss the  possibility of venoocclusive disease however the diagnosis by Doppler testing is academic as there is no cure for venoocclusive disease and the main treatment is a venous compression band  Do not think vascular testing would be of benefit however he requested a second opinion  3.  Premature ejaculation  We discussed the mainstay of treatment is the off label use of SSRI medications.  He is presently on Pristiq and on further research this is found to be when the most effective medications in this class for the treatment of PE.  I also discussed the use of clomipramine however if no improvement on Pristiq unlikely this would be effective and would defer to his psychiatric practitioner  4.  Penile lesion  Was not present on today's exam   Riki Altes, MD  [facility] [address], [address] [place], [place] [address] [phone]

## 2021-03-03 ENCOUNTER — Encounter: Payer: Self-pay | Admitting: Urology

## 2021-03-29 ENCOUNTER — Other Ambulatory Visit: Payer: Self-pay | Admitting: Internal Medicine

## 2021-03-29 DIAGNOSIS — R7989 Other specified abnormal findings of blood chemistry: Secondary | ICD-10-CM

## 2021-03-29 DIAGNOSIS — R945 Abnormal results of liver function studies: Secondary | ICD-10-CM

## 2021-04-02 IMAGING — US US HEPATIC LIVER DOPPLER
1 series · 13 of 25 positions shown · non-contrast
Comparison: Right upper quadrant abdominal ultrasound-01/24/2020;
12/15/2019

CLINICAL DATA: Elevated LFTs. Intermittent right upper quadrant
abdominal pain.

EXAM:
1. ABDOMEN ULTRASOUND COMPLETE
2. HEPATIC DOPPLER ULTRASOUND
TECHNIQUE: Complete abdominal ultrasound was performed followed by color and
duplex Doppler ultrasound to evaluate the hepatic in-flow and
out-flow vessels.

[Series 1: us hepatic liver doppler · 0.23mm/px · 13 of 99 slices shown]
[im 1/99]
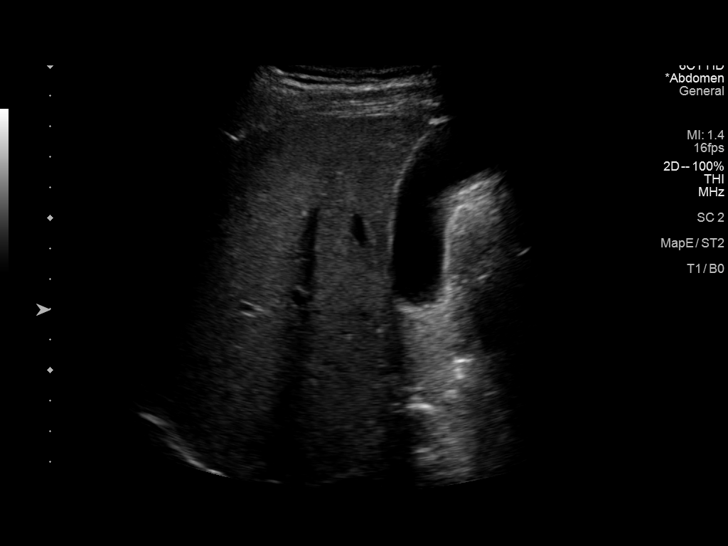
[im 9/99]
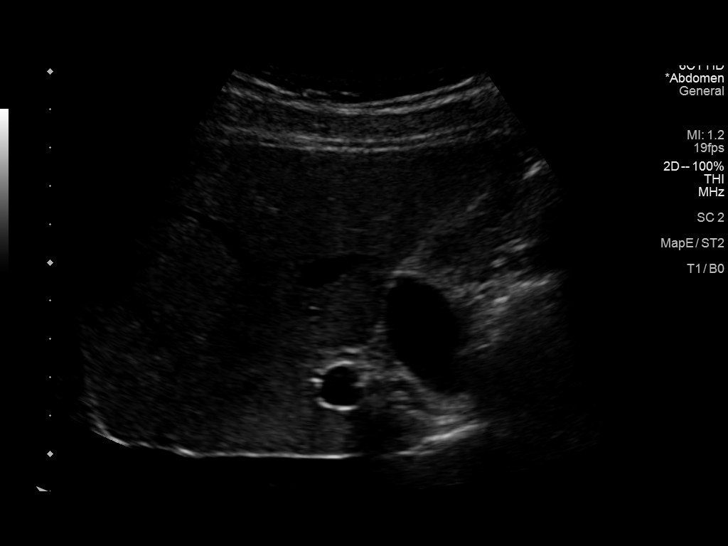
[im 17/99]
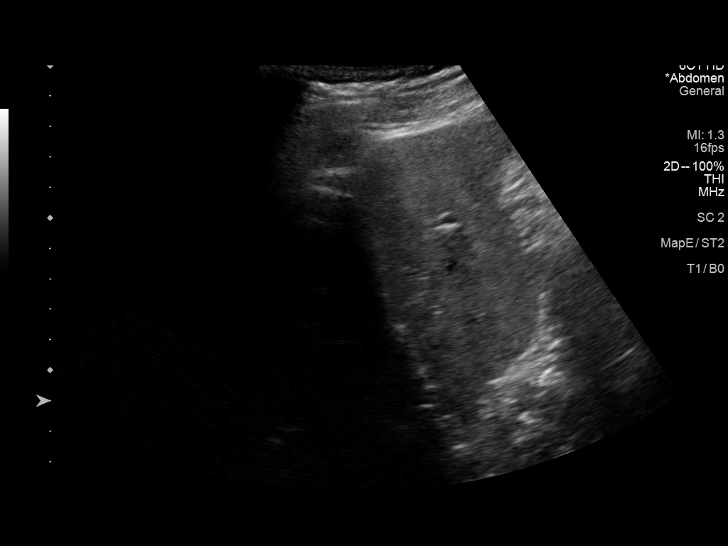
[im 25/99]
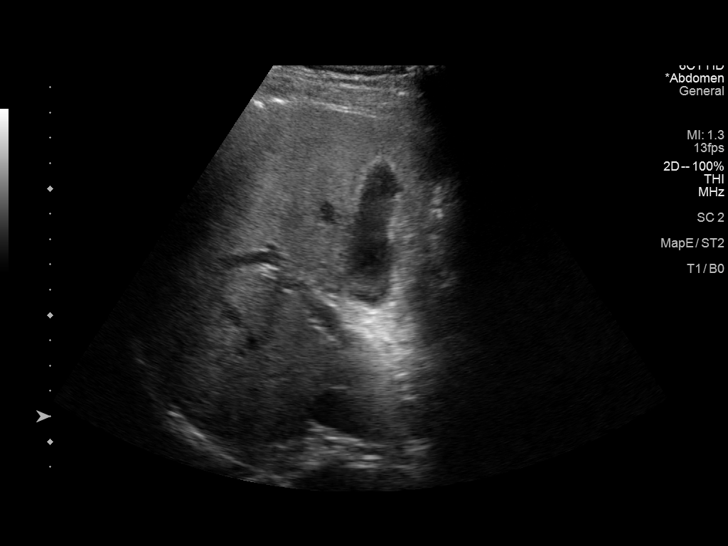
[im 33/99]
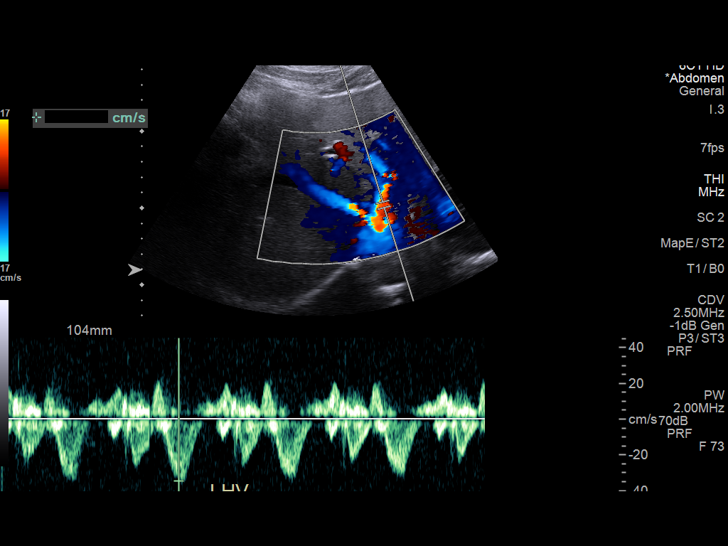
[im 41/99]
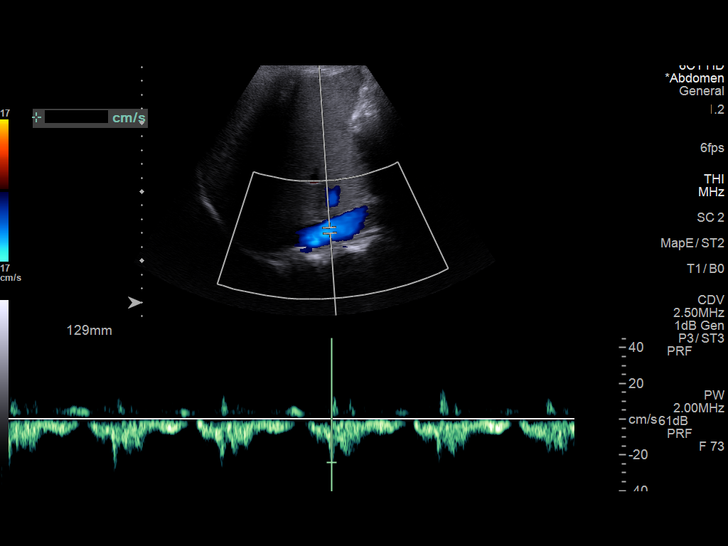
[im 50/99]
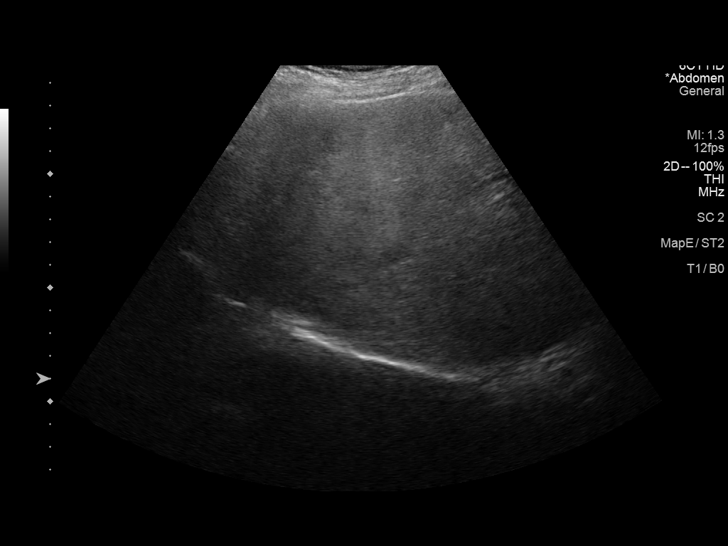
[im 58/99]
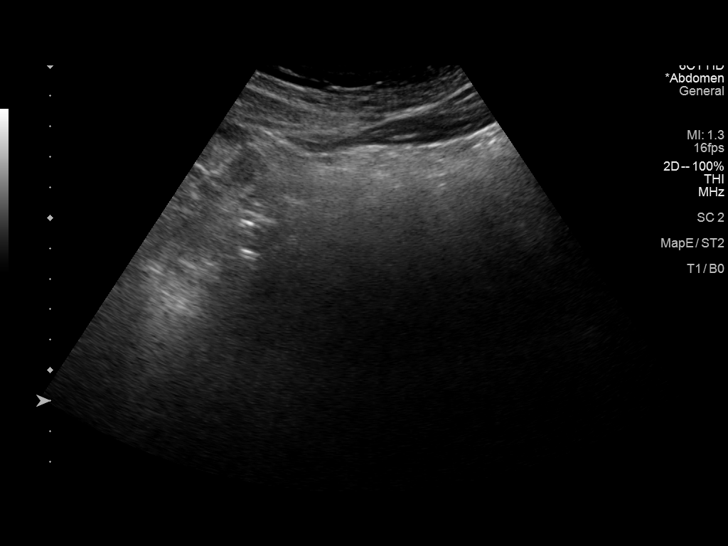
[im 66/99]
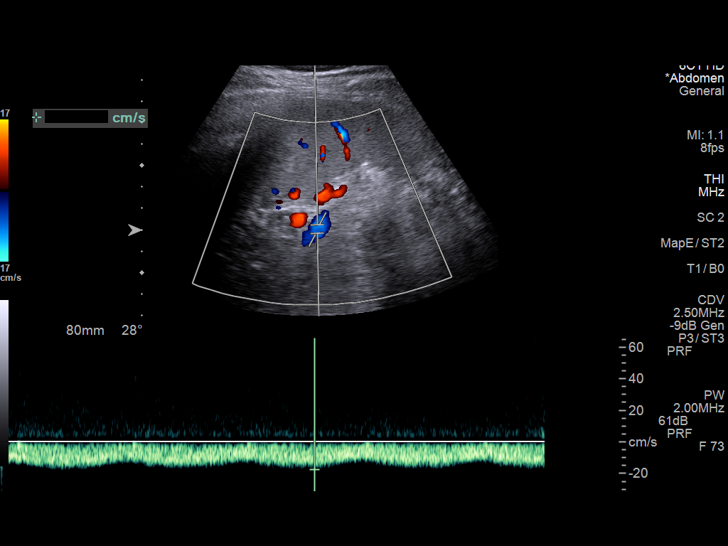
[im 74/99]
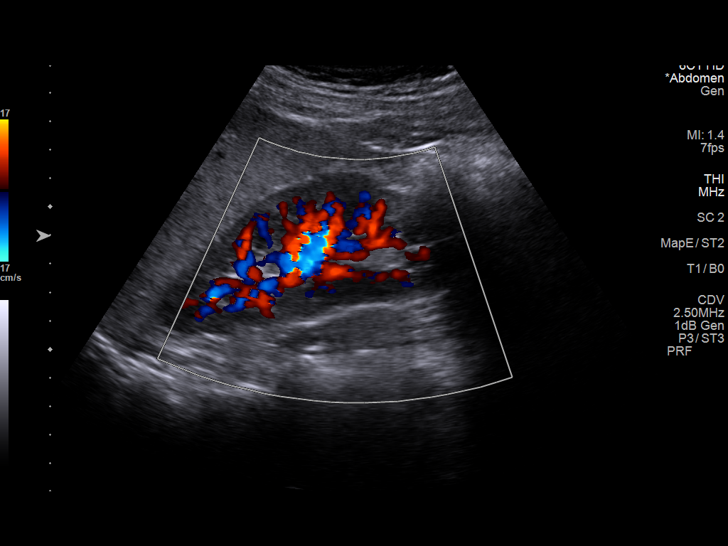
[im 82/99]
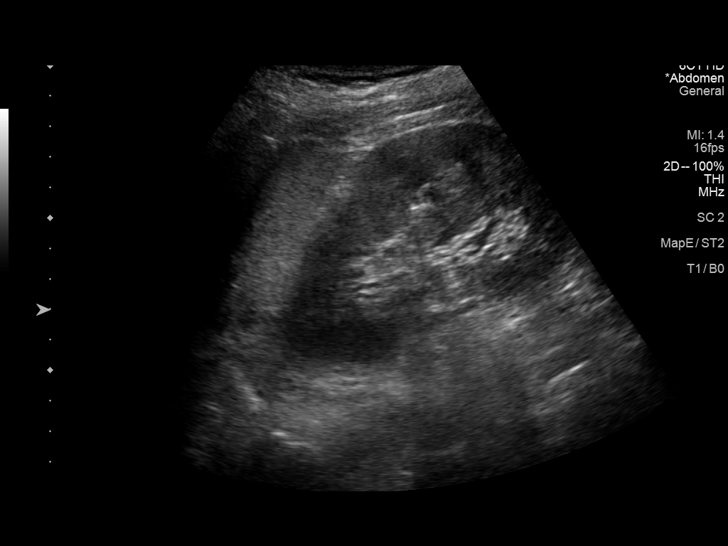
[im 90/99]
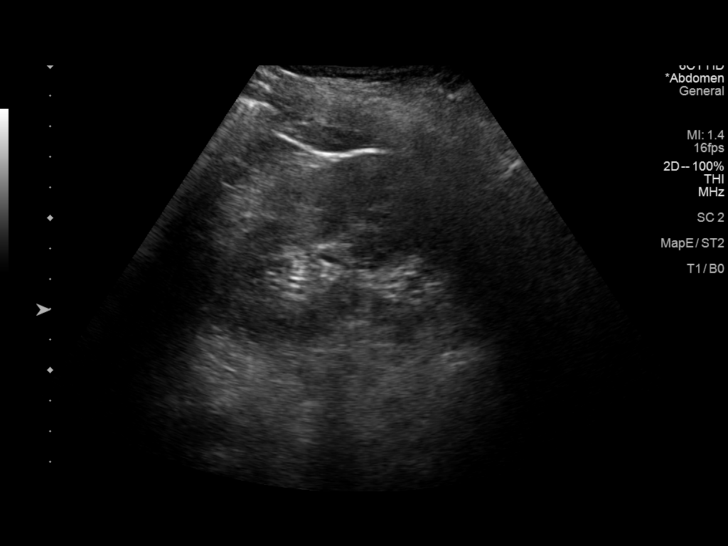
[im 99/99]
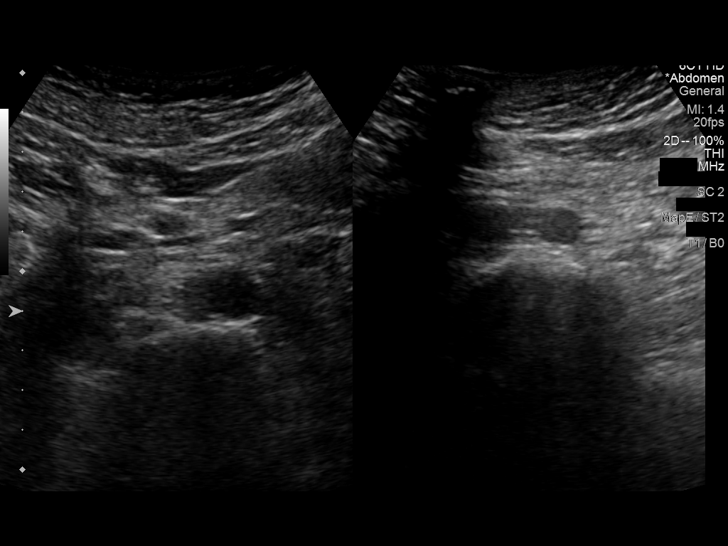

[13 of 25 positions shown; findings below may reference images not displayed]

FINDINGS: Gallbladder: Normal sonographic appearance of the gallbladder. No
gallbladder wall thickening or pericholecystic fluid. No echogenic
stones or biliary sludge. Negative sonographic Murphy sign.

Common bile duct: Diameter: Normal in size measuring 3.3 mm in
diameter

Liver: There is mild increased slightly coarsened echogenicity of
the hepatic parenchyma (image 52). No discrete hepatic lesions. No
intrahepatic biliary duct dilatation. No ascites.

Main Portal Vein size: 0.9 cm

Portal Vein Velocities

Main Prox:  35 cm/sec

Main Mid: 38 cm/sec

Main Dist:  39 cm/sec
Right: 21 cm/sec
Left: 19 cm/sec

Hepatic Vein Velocities

Right:  17 cm/sec

Middle:  27 cm/sec

Left:  35 cm/sec

IVC: Present and patent with normal respiratory phasicity.

Hepatic Artery Velocity: 55 cm/sec

Splenic Vein Velocity: 23 cm/sec

Spleen: 10.3 cm x 11.7 cm x 4.1 cm with a total volume of 255 cm^3
(411 cm^3 is upper limit normal)

Portal Vein Occlusion/Thrombus: No

Splenic Vein Occlusion/Thrombus: No

Ascites: None

Varices: None

_________________________________________________________

Pancreas: Obscured by bowel gas

Spleen: Normal in size measuring 10.3 cm in length. No perisplenic
fluid.

Right Kidney: Normal cortical thickness, echogenicity and size,
measuring 12.4 cm in length. No focal renal lesions. No echogenic
renal stones. No urinary obstruction.

Left Kidney: Normal cortical thickness, echogenicity and size,
measuring 11.8 cm in length. No focal renal lesions. No echogenic
renal stones. No urinary obstruction.

Abdominal aorta: Normal caliber the abdominal aorta.

Other findings: None.
IMPRESSION: 1. Redemonstrated increased slightly coarsened echogenicity of the
hepatic parenchyma as could be seen in the setting hepatic
steatosis. No discrete hepatic lesions.
2. No explanation for patient's intermittent right upper quadrant
abdominal pain. Specifically, no evidence of cholelithiasis. If
there is clinical concern for biliary dyskinesia, further evaluation
with nuclear medicine HIDA scan with CCK augmentation could be
performed as indicated.

## 2021-04-05 ENCOUNTER — Other Ambulatory Visit: Payer: Self-pay

## 2021-04-05 ENCOUNTER — Ambulatory Visit (HOSPITAL_COMMUNITY)
Admission: RE | Admit: 2021-04-05 | Discharge: 2021-04-05 | Disposition: A | Discharge status: Home / Self Care | Payer: BC Managed Care – PPO | Source: Ambulatory Visit | Attending: Internal Medicine | Admitting: Internal Medicine

## 2021-04-05 DIAGNOSIS — R7989 Other specified abnormal findings of blood chemistry: Secondary | ICD-10-CM

## 2021-04-05 DIAGNOSIS — R945 Abnormal results of liver function studies: Secondary | ICD-10-CM | POA: Diagnosis not present

## 2021-04-11 ENCOUNTER — Ambulatory Visit (INDEPENDENT_AMBULATORY_CARE_PROVIDER_SITE_OTHER): Payer: BC Managed Care – PPO | Admitting: Cardiology

## 2021-04-11 ENCOUNTER — Encounter: Payer: Self-pay | Admitting: Cardiology

## 2021-04-11 ENCOUNTER — Other Ambulatory Visit: Payer: Self-pay

## 2021-04-11 VITALS — BP 122/72 | HR 63 | Ht 69.0 in | Wt 161.2 lb

## 2021-04-11 DIAGNOSIS — R079 Chest pain, unspecified: Secondary | ICD-10-CM

## 2021-04-11 DIAGNOSIS — E785 Hyperlipidemia, unspecified: Secondary | ICD-10-CM | POA: Diagnosis not present

## 2021-04-11 DIAGNOSIS — R011 Cardiac murmur, unspecified: Secondary | ICD-10-CM

## 2021-04-11 NOTE — Progress Notes (Signed)
Cardiology Office Note:    Date:  04/12/2021   ID:  Thomas Fletcher, DOB Oct 07, 2001, MRN [id number]  PCP:  Enid Baas, MD  Cardiologist:  None  Electrophysiologist:  None   Referring MD: Mattie Marlin, DO   Chief Complaint  Patient presents with  . Chest Pain    History of Present Illness:    Thomas Fletcher is a 20 y.o. male with a hx of NAFLD, BPD, GAD who presents for follow-up.  He was referred by Ayesha Rumpf, FNP for evaluation of chest pain, dyspnea, heart murmur, initially seen on 04/02/2020.  Reports chest pain started summer 2020.  Has been occurring about 1-2 times per month.  Describes as left-sided dull aching pain, can last for several days.  Typically will wake up with it in and will persist for several days.  Reports that he runs on the treadmill every other day for 3.5 miles.  Denies any chest pain with this.  Reports chest pain improves with exertion.  Does report that he has shortness of breath.  Particularly notices when he walks upstairs.  Recent labs showed LDL 112 on 3/31.  No smoking history.  No history of heart disease in his immediate family.  TTE on 04/23/2020 showed normal biventricular function, no significant valvular disease.  ETT on 12/27/2020 showed excellent exercise capacity (17.5 METS), no evidence of ischemia.  Since last clinic visit, he reports that he continues to have some intermittent chest pain. The pain usually occurs once or twice a month and will last anywhere from 10 minutes to hours. He still describes it as a far left-sided dull aching pain. He is unsure but believes it may be related to acid reflux, or at least feels similar to that. The chest pain may also occur during exertion. He runs for exercise about 1-2 miles a day. However, this is below his baseline exercise as he recently injured his foot and is recovering. He states he also has a history of anxiety, and knows this could also be a cause for his symptoms. Recently he has also been more  lightheaded, but denies any syncope. He has not recognized any palpitations. He notes his left leg is painful and falls asleep often when he is sleeping, and his extremities will feel cold. 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 For diet he is a vegan and avoids eating meat. He is not a smoker. He is also concerned about possible ED and how it relates to vascular issues, and has consulted a Insurance underwriter.   No past medical history on file.    Current Medications: Current Meds  Medication Sig  . BioGaia Probiotic (BIOGAIA/GERBER SOOTHE) LIQD Take 5 drops by mouth daily at 8 pm.  . [DISCONTINUED] Desvenlafaxine Succinate ER 25 MG TB24 Take 1 tablet by mouth daily.  . [DISCONTINUED] tadalafil (CIALIS) 5 MG tablet Take 1 tablet (5 mg total) by mouth daily as needed for erectile dysfunction. Take one tab one  hour prior to intercourse.     Allergies:   Other   Social History   Socioeconomic History  . Marital status: Single    Spouse name: Not on file  . Number of children: Not on file  . Years of education: Not on file  . Highest education level: Not on file  Occupational History  . Not on file  Tobacco Use  . Smoking status: Never Smoker  . Smokeless tobacco: Never Used  Substance and Sexual Activity  .  Alcohol use: Not on file  . Drug use: Not on file  . Sexual activity: Not on file  Other Topics Concern  . Not on file  Social History Narrative  . Not on file   Social Determinants of Health   Financial Resource Strain: Not on file  Food Insecurity: Not on file  Transportation Needs: Not on file  Physical Activity: Not on file  Stress: Not on file  Social Connections: Not on file     Family History: No history of heart disease in his immediate family  ROS:   Please see the history of present illness.    (+) Left chest pain (+) Left leg pain (+) Lightheadedness (+) Anxiety (+) Coldness in extremities All other systems reviewed and are  negative.  EKGs/Labs/Other Studies Reviewed:    The following studies were reviewed today:   EKG:   05/04/2020: normal sinus rhythm, rate 58, no ST/T abnormalities 04/11/2021: NSR with sinus arrhythmia. Rate 63 bpm, no ST abnormalities   Echo 04/23/2020: 1. Normal study.  2. Left ventricular ejection fraction, by estimation, is 55 to 60%. The  left ventricle has normal function. The left ventricle has no regional  wall motion abnormalities. Left ventricular diastolic parameters were  normal.  3. Right ventricular systolic function is normal. The right ventricular  size is normal. There is normal pulmonary artery systolic pressure.  4. The mitral valve is normal in structure. Trivial mitral valve  regurgitation. No evidence of mitral stenosis.  5. The aortic valve is tricuspid. Aortic valve regurgitation is not  visualized. No aortic stenosis is present.  6. The inferior vena cava is normal in size with greater than 50%  respiratory variability, suggesting right atrial pressure of 3 mmHg.  ETT 12/27/2020:  There was no ST segment deviation noted during stress.  Excellent exercise time of 15 minutes 15 seconds. Normal blood pressure response.  Isolated PVC noted during stress.  No adverse arrhythmias detected.  Overall low risk exercise treadmill test with no electrocardiographic evidence of ischemia.    Recent Labs: No results found for requested labs within last 8760 hours.  Recent Lipid Panel No results found for: CHOL, TRIG, HDL, CHOLHDL, VLDL, LDLCALC, LDLDIRECT  Physical Exam:    VS:  BP 122/72   Pulse 63   Ht 5\' 9"  (1.753 m)   Wt 161 lb 3.2 oz (73.1 kg)   SpO2 99%   BMI 23.81 kg/m     Wt Readings from Last 3 Encounters:  04/11/21 161 lb 3.2 oz (73.1 kg)  02/28/21 160 lb (72.6 kg)  12/20/20 157 lb (71.2 kg)     GEN:  Well nourished, well developed in no acute distress HEENT: Normal NECK: No JVD LYMPHATICS: No lymphadenopathy CARDIAC: RRR, 1/6  systolic heart murmur RESPIRATORY:  Clear to auscultation without rales, wheezing or rhonchi  ABDOMEN: Soft, non-tender, non-distended MUSCULOSKELETAL:  No edema; No deformity  SKIN: Warm and dry NEUROLOGIC:  Alert and oriented x 3 PSYCHIATRIC:  Normal affect   ASSESSMENT:    1. Chest pain of uncertain etiology   2. Cardiac murmur   3. Hyperlipidemia, unspecified hyperlipidemia type    PLAN:     Chest pain: ETT on 12/27/2020 showed excellent exercise capacity (17.5 METS), no evidence of ischemia.  Noncardiac chest pain suspected  Heart murmur: Suspect benign flow murmur, TTE shows no evidence of structural heart disease  Hyperlipidemia: LDL 112, reports previously was in 130s but has improved with diet/exercise.  Given age and lack of  risk factors, no indication for statin at this time.  He has been evaluated by Dr. Rennis Golden in lipid clinic, did not recommend any medication at this time  RTC as needed.   Medication Adjustments/Labs and Tests Ordered: Current medicines are reviewed at length with the patient today.  Concerns regarding medicines are outlined above.  Orders Placed This Encounter  Procedures  . EKG 12-Lead   No orders of the defined types were placed in this encounter.   Patient Instructions  Medication Instructions:  Your physician recommends that you continue on your current medications as directed. Please refer to the Current Medication list given to you today.  Follow-Up: At [facility], you and your health needs are our priority.  As part of our continuing mission to provide you with exceptional heart care, we have created designated Provider Care Teams.  These Care Teams include your primary Cardiologist (physician) and Advanced Practice Providers (APPs -  Physician Assistants and Nurse Practitioners) who all work together to provide you with the care you need, when you need it.  We recommend signing up for the patient portal called "MyChart".  Sign up  information is provided on this After Visit Summary.  MyChart is used to connect with patients for Virtual Visits (Telemedicine).  Patients are able to view lab/test results, encounter notes, upcoming appointments, etc.  Non-urgent messages can be sent to your provider as well.   To learn more about what you can do with MyChart, go to [email or web address].    Your next appointment:   AS NEEDED with Dr. Nathanial Rancher Stumpf,acting as a scribe for Little Ishikawa, MD.,have documented all relevant documentation on the behalf of Little Ishikawa, MD,as directed by  Little Ishikawa, MD while in the presence of Little Ishikawa, MD.  I, Little Ishikawa, MD, have reviewed all documentation for this visit. The documentation on 04/12/21 for the exam, diagnosis, procedures, and orders are all accurate and complete.   Signed, Little Ishikawa, MD  04/12/2021 11:05 AM    [facility]

## 2021-04-11 NOTE — Patient Instructions (Signed)
Medication Instructions:  Your physician recommends that you continue on your current medications as directed. Please refer to the Current Medication list given to you today.  Follow-Up: At [facility], you and your health needs are our priority.  As part of our continuing mission to provide you with exceptional heart care, we have created designated Provider Care Teams.  These Care Teams include your primary Cardiologist (physician) and Advanced Practice Providers (APPs -  Physician Assistants and Nurse Practitioners) who all work together to provide you with the care you need, when you need it.  We recommend signing up for the patient portal called "MyChart".  Sign up information is provided on this After Visit Summary.  MyChart is used to connect with patients for Virtual Visits (Telemedicine).  Patients are able to view lab/test results, encounter notes, upcoming appointments, etc.  Non-urgent messages can be sent to your provider as well.   To learn more about what you can do with MyChart, go to [email or web address].    Your next appointment:   AS NEEDED with Dr. Schumann 

## 2021-06-05 IMAGING — NM NM HEPATO W/GB/PHARM/[PERSON_NAME]
2 series · 12 of 12 positions shown · non-contrast
Comparison: Ultrasound 09/13/2020

CLINICAL DATA: Dull discomfort after eating

EXAM:
NUCLEAR MEDICINE HEPATOBILIARY IMAGING WITH GALLBLADDER EF
TECHNIQUE: Sequential images of the abdomen were obtained [DATE] minutes
following intravenous administration of radiopharmaceutical. After
oral ingestion of Ensure, gallbladder ejection fraction was
determined. At 60 min, normal ejection fraction is greater than 33%.
RADIOPHARMACEUTICALS:  5.4 mCi Zc-MMm  Choletec IV

[he hepatobiliary · 4.52mm/px · 6 of 60 frames shown (1 of 2)]
[frame 6/60]
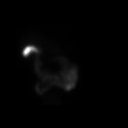
[frame 16/60]
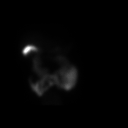
[frame 26/60]
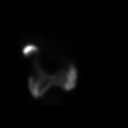
[frame 36/60]
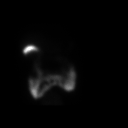
[frame 46/60]
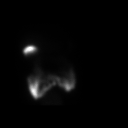
[frame 56/60]
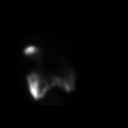

[he hepatobiliary · 4.52mm/px · 6 of 60 frames shown (2 of 2)]
[frame 6/60]
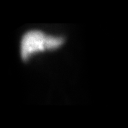
[frame 16/60]
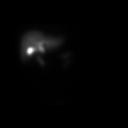
[frame 26/60]
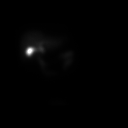
[frame 36/60]
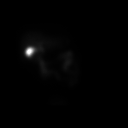
[frame 46/60]
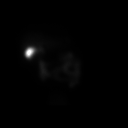
[frame 56/60]
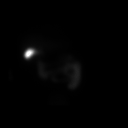

[12 of 12 positions shown; findings below may reference images not displayed]

FINDINGS: Prompt uptake and biliary excretion of activity by the liver is
seen. Gallbladder activity is visualized, consistent with patency of
cystic duct. Biliary activity passes into small bowel, consistent
with patent common bile duct.

Calculated gallbladder ejection fraction is 64%. (Normal gallbladder
ejection fraction with Ensure is greater than 33%.)
IMPRESSION: Negative examination

## 2021-10-23 IMAGING — US US ABDOMEN LIMITED RUQ/ASCITES
1 series · 14 of 25 positions shown · non-contrast
Comparison: None.

CLINICAL DATA: Elevated LFTs

EXAM:
ULTRASOUND ABDOMEN LIMITED RIGHT UPPER QUADRANT

[Series 1: us abdomen limited ruq/ascites · 14 of 51 slices shown]
[im 1/51]
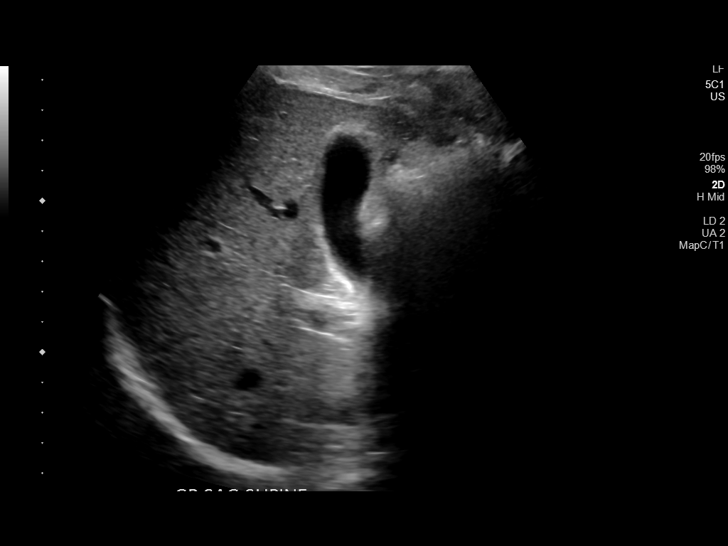
[im 5/51]
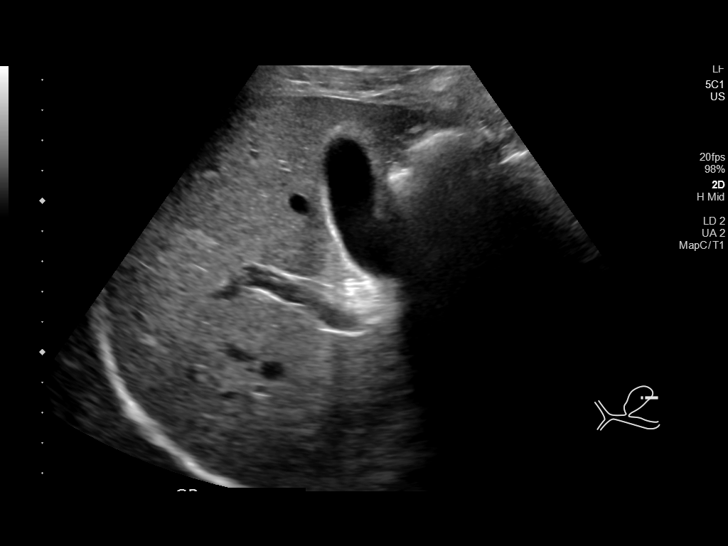
[im 9/51]
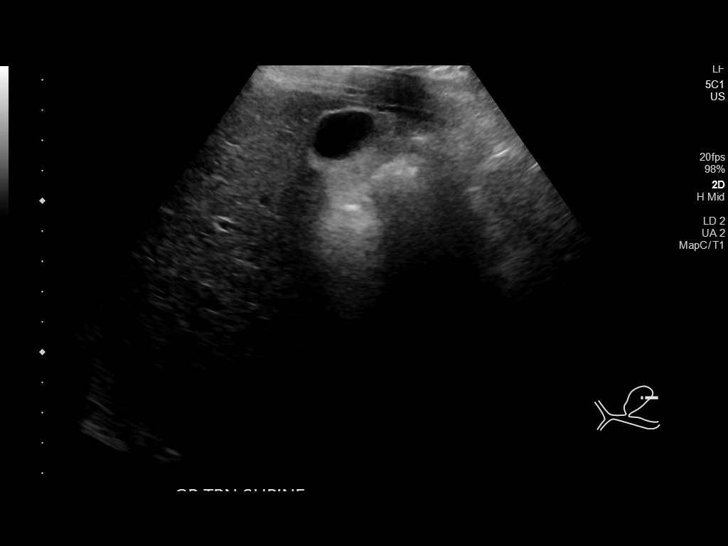
[im 13/51]
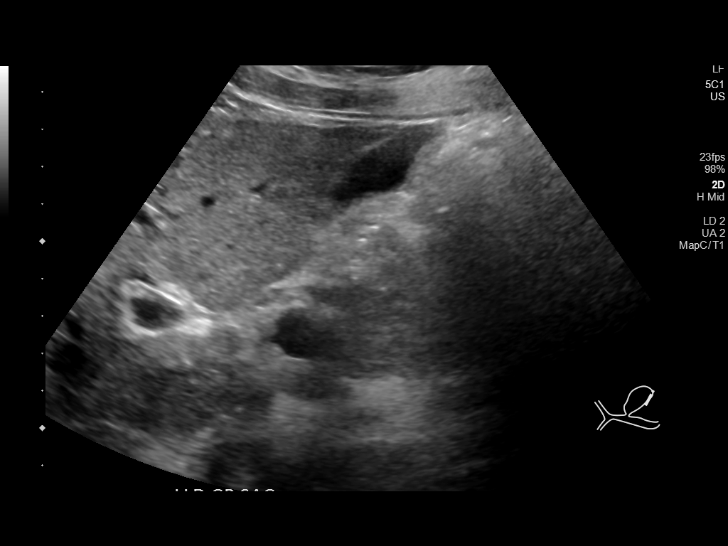
[im 17/51]
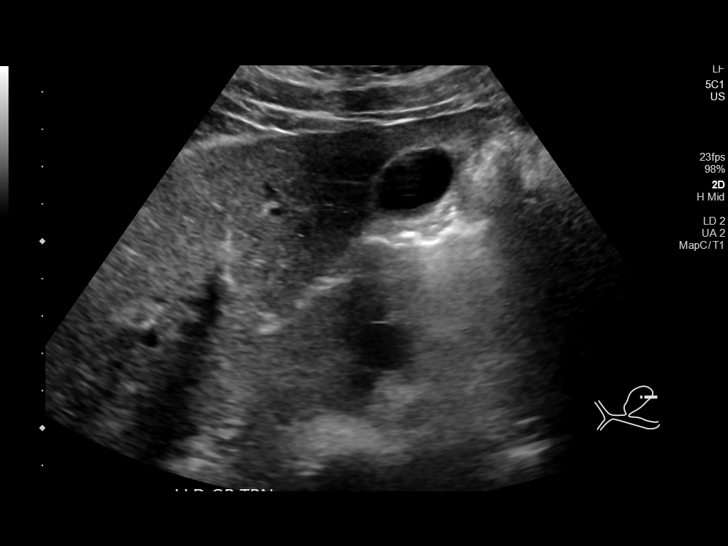
[im 19/51]
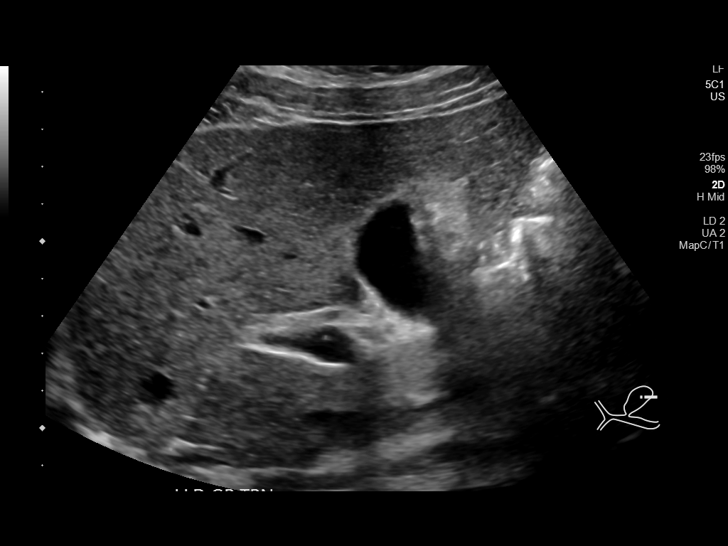
[im 23/51]
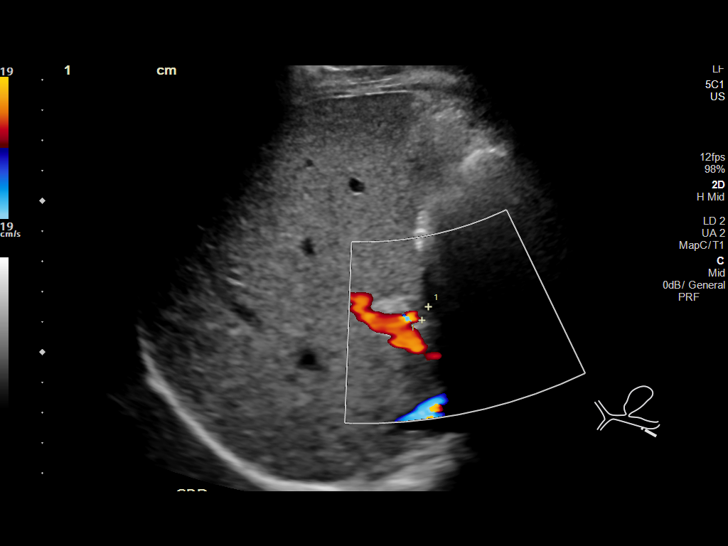
[im 28/51]
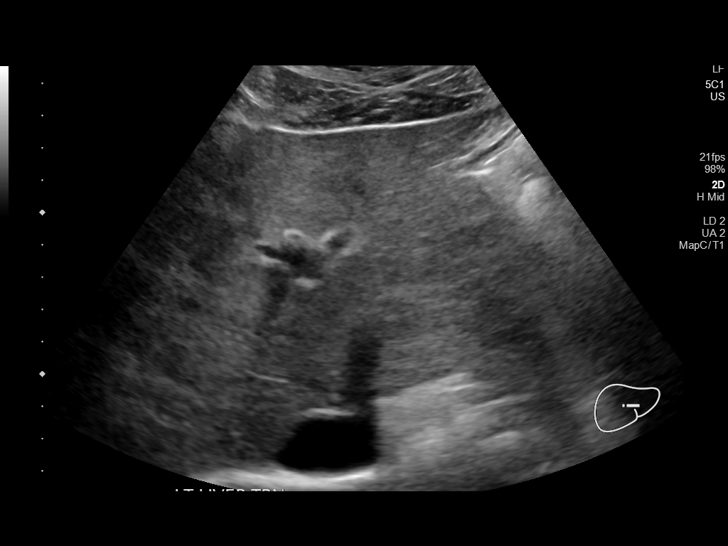
[im 32/51]
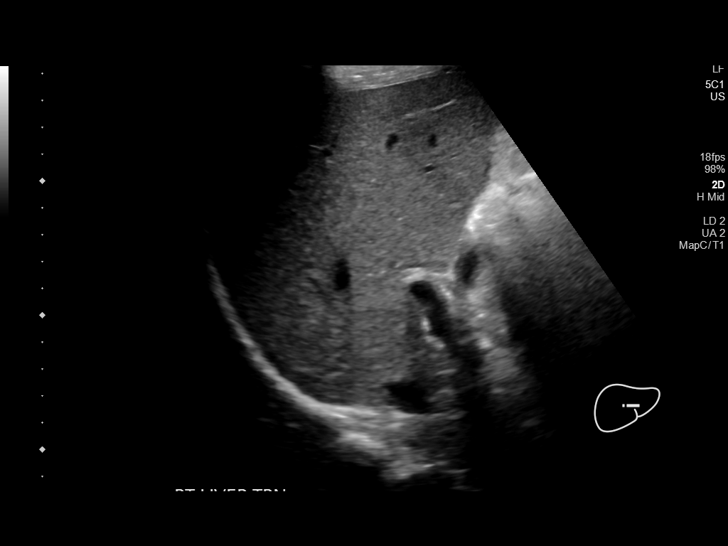
[im 34/51]
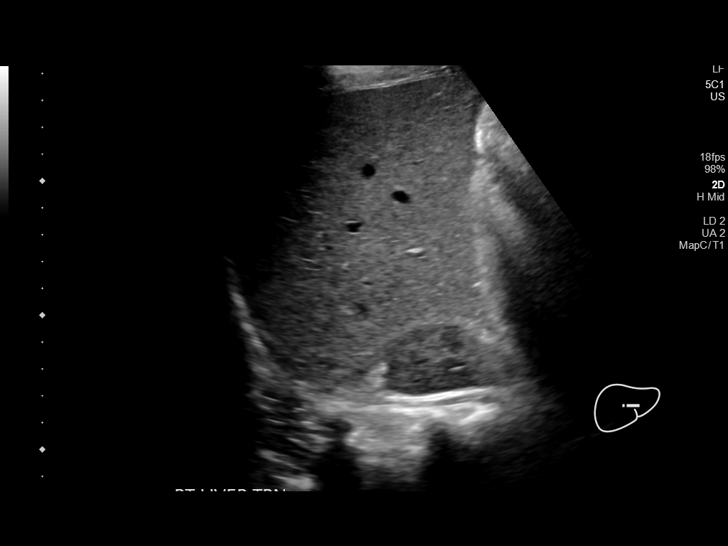
[im 38/51]
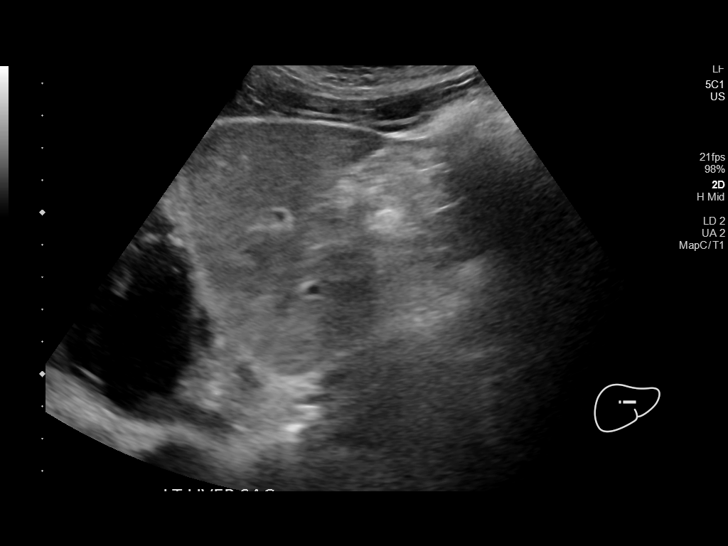
[im 42/51]
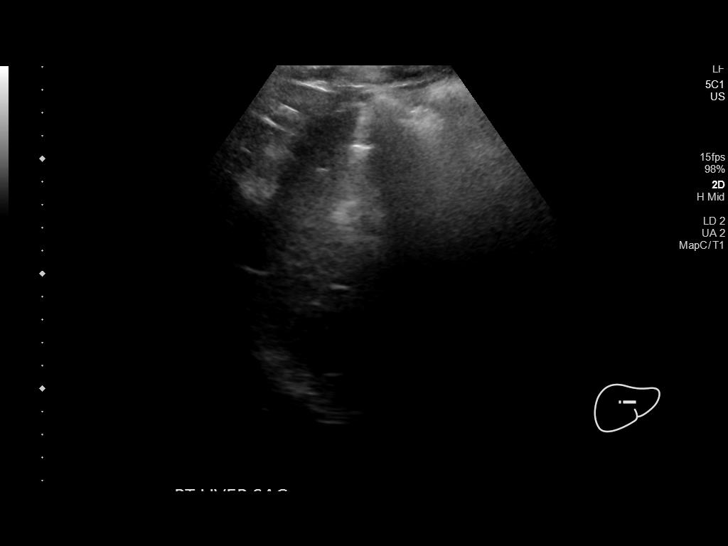
[im 46/51]
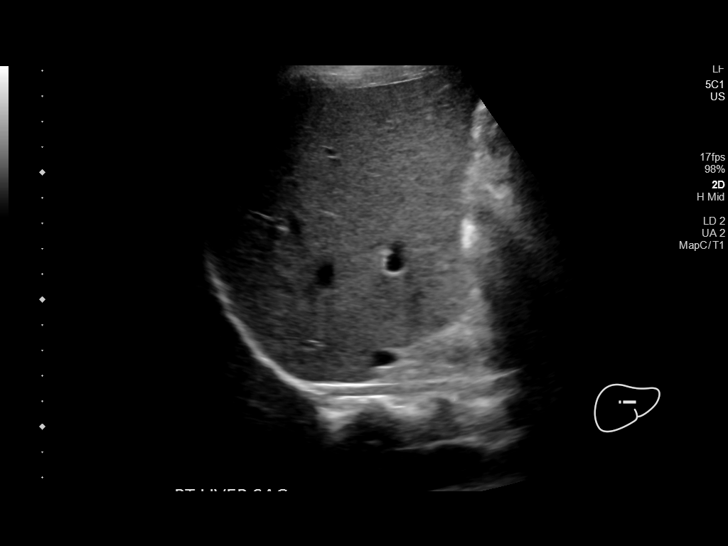
[im 51/51]
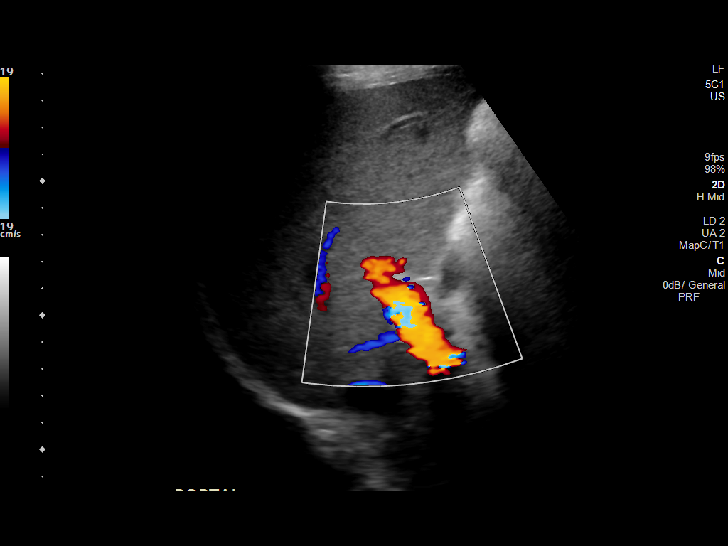

[14 of 25 positions shown; findings below may reference images not displayed]

FINDINGS: Gallbladder:

No gallstones or wall thickening visualized. No sonographic Murphy
sign noted by sonographer.

Common bile duct:

Diameter: 4.9 mm

Liver:

Increased echogenicity. No focal mass. Portal vein is patent on
color Doppler imaging with normal direction of blood flow towards
the liver.

Other: None.
IMPRESSION: Mild increased echogenicity in the liver is nonspecific but often
due to hepatic steatosis.
